# Patient Record
Sex: Female | Born: 1970 | Race: White | Hispanic: No | Marital: Single
Health system: Southern US, Community
[De-identification: ages and names within clinical notes are randomized; demographics above are authoritative.]

---

## 2018-07-13 ENCOUNTER — Emergency Department (HOSPITAL_COMMUNITY): Payer: Federal, State, Local not specified - PPO

## 2018-07-13 ENCOUNTER — Encounter (HOSPITAL_COMMUNITY): Payer: Self-pay

## 2018-07-13 ENCOUNTER — Emergency Department (HOSPITAL_COMMUNITY)
Admission: EM | Admit: 2018-07-13 | Discharge: 2018-07-13 | Disposition: A | Discharge status: Home / Self Care | Payer: Federal, State, Local not specified - PPO | Attending: Emergency Medicine | Admitting: Emergency Medicine

## 2018-07-13 DIAGNOSIS — R1084 Generalized abdominal pain: Secondary | ICD-10-CM | POA: Insufficient documentation

## 2018-07-13 DIAGNOSIS — M545 Low back pain, unspecified: Secondary | ICD-10-CM

## 2018-07-13 LAB — URINALYSIS, ROUTINE W REFLEX MICROSCOPIC
BILIRUBIN URINE: NEGATIVE
Glucose, UA: NEGATIVE mg/dL
KETONES UR: NEGATIVE mg/dL
LEUKOCYTES UA: NEGATIVE
NITRITE: NEGATIVE
PH: 5 (ref 5.0–8.0)
Protein, ur: NEGATIVE mg/dL
SPECIFIC GRAVITY, URINE: 1.026 (ref 1.005–1.030)

## 2018-07-13 MED ORDER — LIDOCAINE 5 % EX PTCH: 1.0000 | MEDICATED_PATCH | CUTANEOUS | 0 refills | Status: AC

## 2018-07-13 MED ORDER — KETOROLAC TROMETHAMINE 15 MG/ML IJ SOLN
30.0000 mg | Freq: Once | INTRAMUSCULAR | Status: AC
  Administered 2018-07-13: 30 mg via INTRAVENOUS
  Filled 2018-07-13: qty 2

## 2018-07-13 MED ORDER — CYCLOBENZAPRINE HCL 5 MG PO TABS: 5.0000 mg | ORAL_TABLET | Freq: Three times a day (TID) | ORAL | 0 refills | Status: AC | PRN

## 2018-07-13 MED ORDER — SODIUM CHLORIDE 0.9 % IV SOLN
INTRAVENOUS | Status: AC
  Administered 2018-07-13: 15:00:00 via INTRAVENOUS

## 2018-07-13 MED ORDER — LIDOCAINE 5 % EX PTCH
1.0000 | MEDICATED_PATCH | Freq: Once | CUTANEOUS | Status: DC
  Administered 2018-07-13: 1 via TRANSDERMAL
  Filled 2018-07-13: qty 1

## 2018-07-13 MED ORDER — DICLOFENAC SODIUM 50 MG PO TBEC: 50.0000 mg | DELAYED_RELEASE_TABLET | Freq: Two times a day (BID) | ORAL | 0 refills | Status: AC

## 2018-07-13 MED ORDER — OXYCODONE-ACETAMINOPHEN 5-325 MG PO TABS
1.0000 | ORAL_TABLET | ORAL | Status: DC | PRN
  Administered 2018-07-13: 1 via ORAL
  Filled 2018-07-13: qty 1

## 2018-07-13 NOTE — ED Triage Notes (Signed)
Pt presents for evaluation of R flank pain starting this morning. Hx of kidney stones.

## 2018-07-13 NOTE — ED Provider Notes (Signed)
[facility]   CSN: [id number] Arrival date & time: 07/13/18  1341     History   Chief Complaint Chief Complaint  Patient presents with  . Flank Pain    HPI Elizabeth Cowan is a 47 y.o. female who presents to the ED with right flank pain. The pain started this morning suddenly. No nausea or vomiting. Patient reports she was going to try to drive 2 hours home but the pain was so severe she had to stop here. Patient [place] on [employer].   The history is provided by the patient. No language interpreter was used.  Flank Pain  This is a new problem. The current episode started 3 to 5 hours ago. The problem occurs constantly. The problem has been gradually worsening. Nothing aggravates the symptoms. Nothing relieves the symptoms. She has tried nothing for the symptoms.    History reviewed. No pertinent past medical history.  There are no active problems to display for this patient.   History reviewed. No pertinent surgical history.   OB History   None      Home Medications    Prior to Admission medications   Medication Sig Start Date End Date Taking? Authorizing Provider  cyclobenzaprine (FLEXERIL) 5 MG tablet Take 1 tablet (5 mg total) by mouth 3 (three) times daily as needed. 07/13/18   Janne NapoleonNeese, Maclin Guerrette M, NP  diclofenac (VOLTAREN) 50 MG EC tablet Take 1 tablet (50 mg total) by mouth 2 (two) times daily. 07/13/18   Janne NapoleonNeese, Auguste Tebbetts M, NP  lidocaine (LIDODERM) 5 % Place 1 patch onto the skin daily. Remove & Discard patch within 12 hours or as directed by MD 07/13/18   Janne NapoleonNeese, Davit Vassar M, NP    Family History No family history on file.  Social History Social History   Tobacco Use  . Smoking status: Not on file  Substance Use Topics  . Alcohol use: Not on file  . Drug use: Not on file     Allergies   Patient has no known allergies.   Review of Systems Review of Systems  Genitourinary: Positive for flank pain.    All other systems reviewed and are negative.    Physical Exam Updated Vital Signs BP 122/74 (BP Location: Right Arm)   Pulse 79   Temp 98.1 F (36.7 C) (Oral)   Resp 18   SpO2 99%   Physical Exam  Constitutional: She appears well-developed and well-nourished. No distress.  HENT:  Head: Normocephalic.  Eyes: Conjunctivae and EOM are normal.  Neck: Neck supple.  Cardiovascular: Normal rate.  Pulmonary/Chest: Effort normal.  Abdominal: Soft. There is no tenderness.  Musculoskeletal:       Lumbar back: She exhibits tenderness and pain. She exhibits normal range of motion, no laceration and normal pulse.       Back:  Point tenderness to the right lumbar area.  Neurological: She is alert. She has normal strength. Gait normal.  Skin: Skin is warm and dry.  Psychiatric: She has a normal mood and affect. Her behavior is normal.  Nursing note and vitals reviewed.    ED Treatments / Results  Labs (all labs ordered are listed, but only abnormal results are displayed) Labs Reviewed  URINALYSIS, ROUTINE W REFLEX MICROSCOPIC - Abnormal; Notable for the following components:      Result Value   APPearance HAZY (*)    Hgb urine dipstick MODERATE (*)    Bacteria, UA RARE (*)  All other components within normal limits    Radiology Ct Renal Stone Study  Result Date: 07/13/2018 CLINICAL DATA:  Right flank pain and hematuria beginning this morning. EXAM: CT ABDOMEN AND PELVIS WITHOUT CONTRAST TECHNIQUE: Multidetector CT imaging of the abdomen and pelvis was performed following the standard protocol without IV contrast. COMPARISON:  None. FINDINGS: Lower chest: Lung bases are clear. Hepatobiliary: Gallbladder is contracted. Liver and biliary tree are normal. Pancreas: Normal. Spleen: Normal. Adrenals/Urinary Tract: Adrenal glands are normal. Kidneys normal size without hydronephrosis or nephrolithiasis. Ureters and bladder are normal. Stomach/Bowel: Stomach and small bowel are normal.  The appendix is normal. Colon is normal. Vascular/Lymphatic: Mild calcified plaque over abdominal aorta. No adenopathy. Reproductive: Evidence of previous hysterectomy. Other: No free fluid or focal inflammatory change. Musculoskeletal: Normal. IMPRESSION: No acute findings in the abdomen/pelvis. Aortic Atherosclerosis (ICD10-I70.0). Electronically Signed   By: Elberta Fortis M.D.   On: 07/13/2018 15:29    Procedures Procedures (including critical care time)  Medications Ordered in ED Medications  0.9 %  sodium chloride infusion ( Intravenous Stopped 07/13/18 1639)  ketorolac (TORADOL) 15 MG/ML injection 30 mg (30 mg Intravenous Given 07/13/18 1500)     Initial Impression / Assessment and Plan / ED Course  I have reviewed the triage vital signs and the nursing notes.  Final Clinical Impressions(s) / ED Diagnoses  47 y.o. female here with right side low back pain that does not radiate. Patient stable for d/c without kidney stone noted on CT. Pain relieved with treatment in the ED. Urine dip had blood but only few RBC's on micro. Discussed with Dr. Lockie Mola and will treat for muscle strain. Patient will f/u with her PCP at home.   Final diagnoses:  Acute right-sided low back pain without sciatica    ED Discharge Orders         Ordered    lidocaine (LIDODERM) 5 %  Every 24 hours     07/13/18 1654    diclofenac (VOLTAREN) 50 MG EC tablet  2 times daily     07/13/18 1654    cyclobenzaprine (FLEXERIL) 5 MG tablet  3 times daily PRN     07/13/18 1654           Janne Napoleon, NP 07/14/18 1447    Virgina Norfolk, DO 07/15/18 0142

## 2018-07-13 NOTE — Discharge Instructions (Addendum)
Follow up with your primary care doctor or return for worsening symptoms.

## 2019-09-16 IMAGING — CT CT RENAL STONE PROTOCOL
2 of 4 series · 17 of 46 positions shown, 19 images · non-contrast
Comparison: None.

CLINICAL DATA: Right flank pain and hematuria beginning this
morning.

EXAM:
CT ABDOMEN AND PELVIS WITHOUT CONTRAST
TECHNIQUE: Multidetector CT imaging of the abdomen and pelvis was performed
following the standard protocol without IV contrast.

[Series 3: ap without · axial · non-contrast · 0.79mm/px · z∈[-825,-420]mm · 14 of 93 slices shown, 16 images]
[im 6/93  soft-tissue]
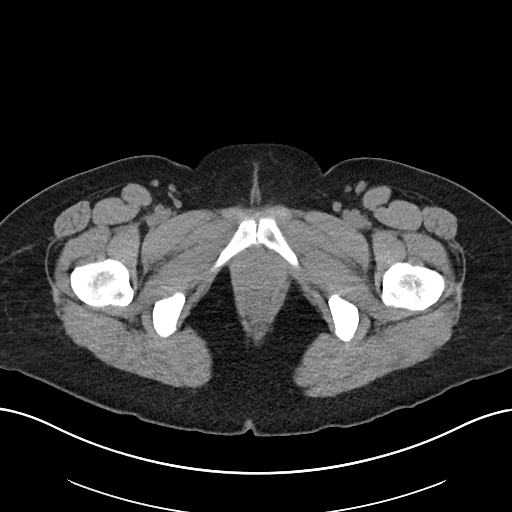
[im 6/93  bone]
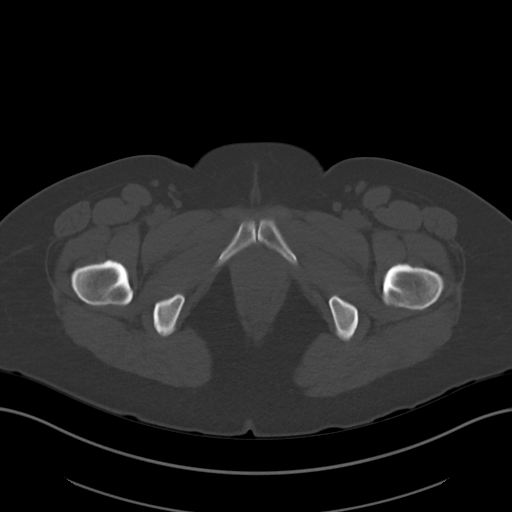
[im 11/93  soft-tissue]
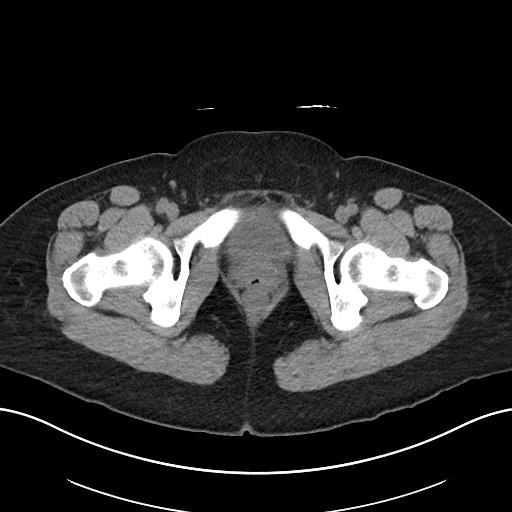
[im 17/93  soft-tissue]
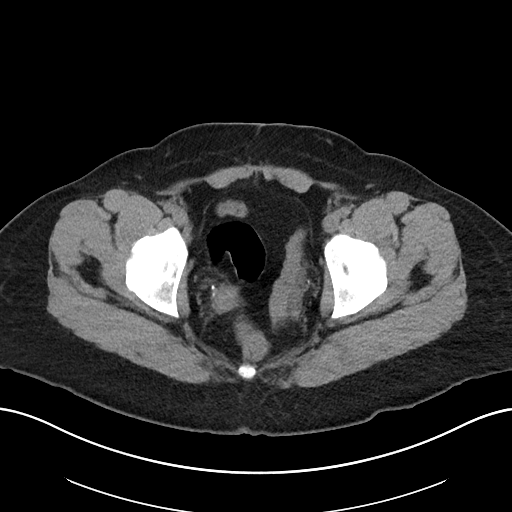
[im 28/93  soft-tissue]
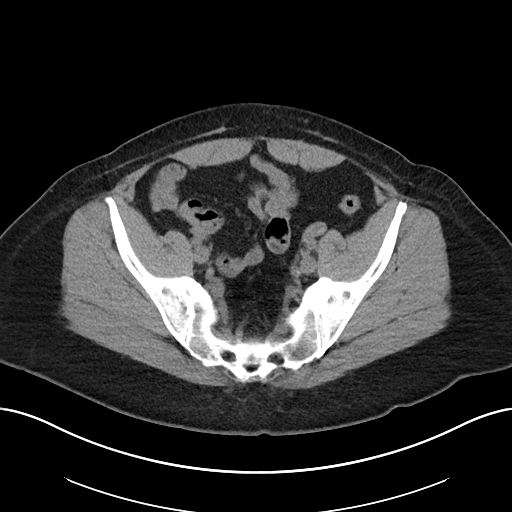
[im 33/93  soft-tissue]
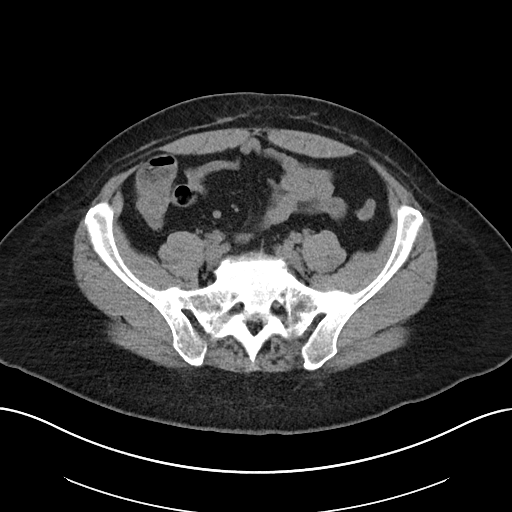
[im 38/93  soft-tissue]
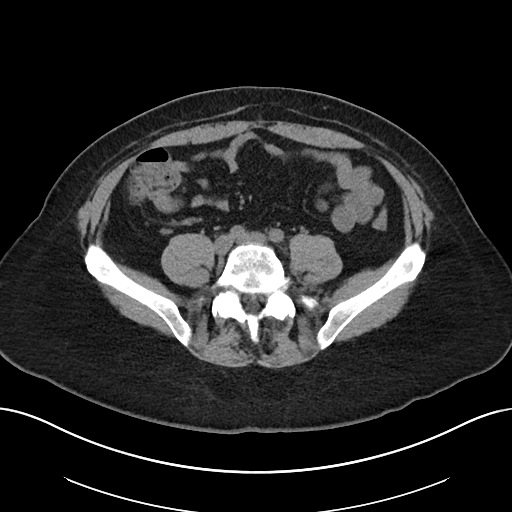
[im 44/93  soft-tissue]
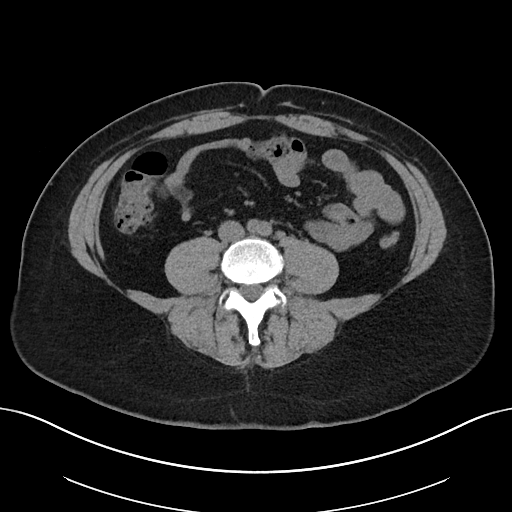
[im 49/93  soft-tissue]
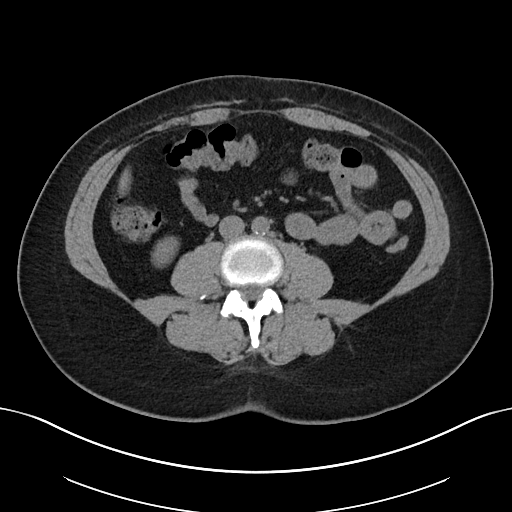
[im 55/93  soft-tissue]
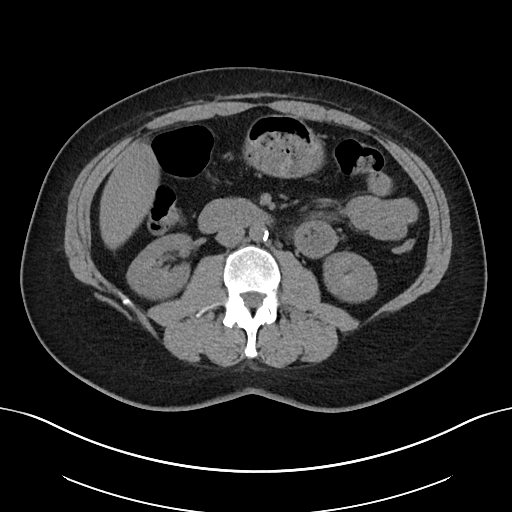
[im 55/93  bone]
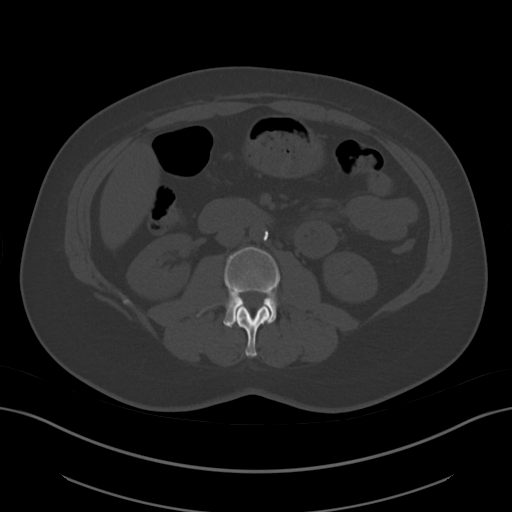
[im 60/93  soft-tissue]
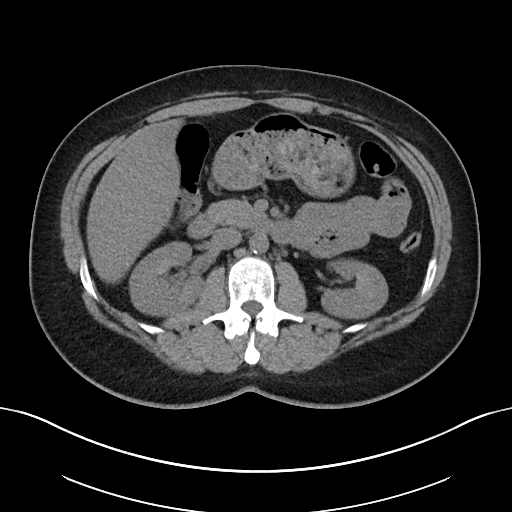
[im 71/93  soft-tissue]
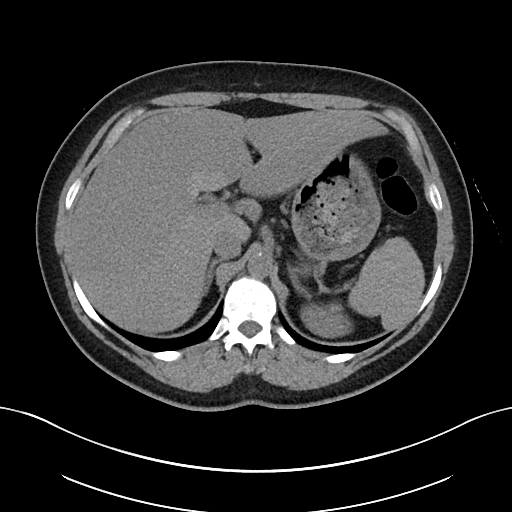
[im 76/93  soft-tissue]
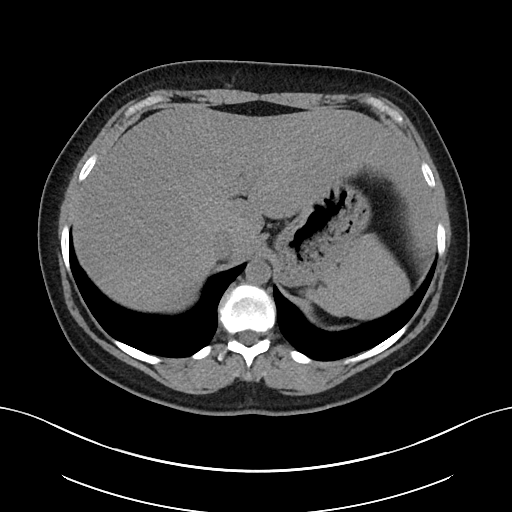
[im 82/93  soft-tissue]
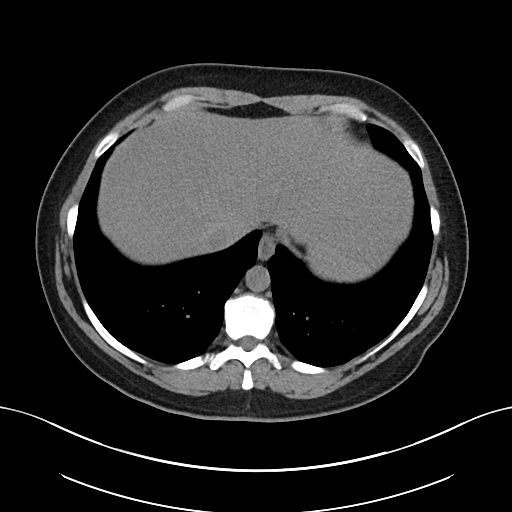
[im 87/93  soft-tissue]
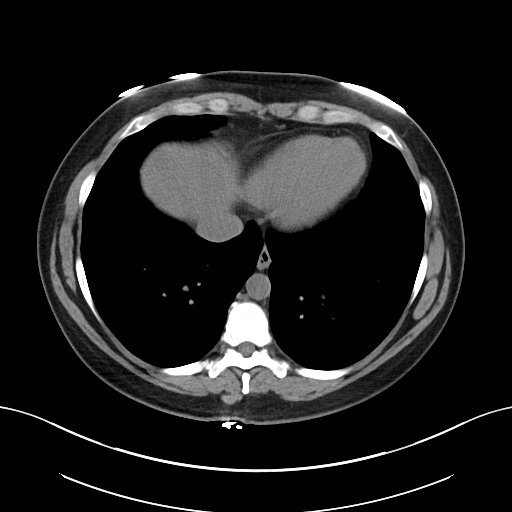

[Series 6: cor · coronal · 0.73mm/px · 3 of 81 slices shown]
[im 27/81  soft-tissue]
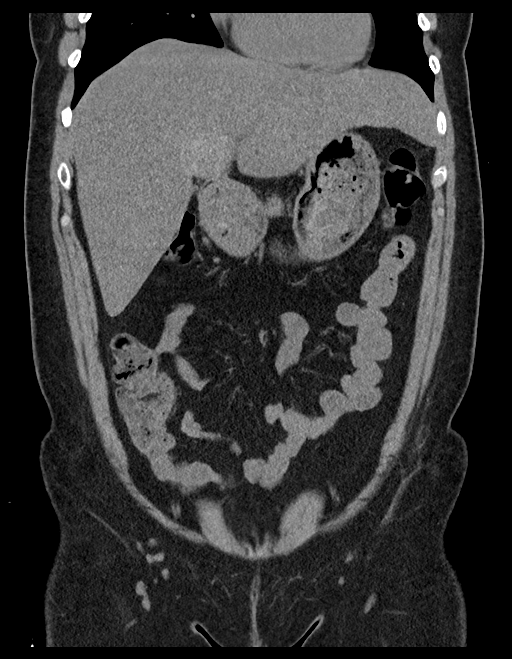
[im 36/81  soft-tissue]
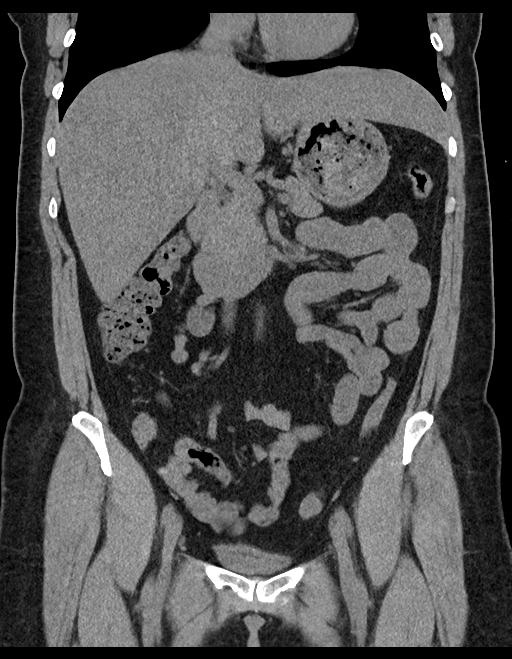
[im 45/81  soft-tissue]
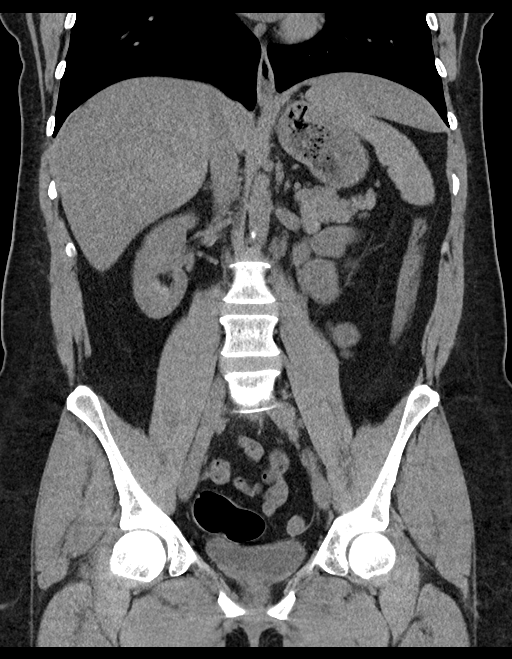

[17 of 46 positions shown; findings below may reference images not displayed]

FINDINGS: Lower chest: Lung bases are clear.

Hepatobiliary: Gallbladder is contracted. Liver and biliary tree are
normal.

Pancreas: Normal.

Spleen: Normal.

Adrenals/Urinary Tract: Adrenal glands are normal. Kidneys normal
size without hydronephrosis or nephrolithiasis. Ureters and bladder
are normal.

Stomach/Bowel: Stomach and small bowel are normal. The appendix is
normal. Colon is normal.

Vascular/Lymphatic: Mild calcified plaque over abdominal aorta. No
adenopathy.

Reproductive: Evidence of previous hysterectomy.

Other: No free fluid or focal inflammatory change.

Musculoskeletal: Normal.
IMPRESSION: No acute findings in the abdomen/pelvis.

Aortic Atherosclerosis ([id number]).
